# Patient Record
Sex: Female | Born: 2000 | Hispanic: Refuse to answer | Marital: Single | Smoking: Never smoker
Health system: Southern US, Community
[De-identification: ages and names within clinical notes are randomized; demographics above are authoritative.]

---
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
## 2018-10-16 ENCOUNTER — Ambulatory Visit (INDEPENDENT_AMBULATORY_CARE_PROVIDER_SITE_OTHER): Payer: BLUE CROSS/BLUE SHIELD | Admitting: Family Medicine

## 2018-10-16 VITALS — BP 139/85 | HR 92 | Temp 98.7°F | Resp 14

## 2018-10-16 DIAGNOSIS — R509 Fever, unspecified: Secondary | ICD-10-CM

## 2018-10-16 DIAGNOSIS — J069 Acute upper respiratory infection, unspecified: Secondary | ICD-10-CM

## 2018-10-16 LAB — POCT INFLUENZA A/B
Influenza A, POC: NEGATIVE
Influenza B, POC: NEGATIVE

## 2018-10-16 LAB — POCT RAPID STREP A (OFFICE): RAPID STREP A SCREEN: NEGATIVE

## 2018-10-16 MED ORDER — OSELTAMIVIR PHOSPHATE 75 MG PO CAPS: 75.0000 mg | ORAL_CAPSULE | Freq: Two times a day (BID) | ORAL | 0 refills | Status: DC

## 2018-10-16 NOTE — Progress Notes (Signed)
Patient presents today with symptoms of subjective fever, headache, sore throat, nasal congestion, cough, body aches, night sweats.  Patient states that her symptoms started yesterday.  Patient has had infectious mono in the past.  She denies any chest pain, shortness of breath, nausea, vomiting, diarrhea, abdominal pain, rash.  She has taken NyQuil for her symptoms.  She denies any sick contacts.  She denies any history of asthma or smoking.  ROS: Negative except mentioned above. Vitals as per Epic. GENERAL: NAD HEENT: mild pharyngeal erythema, no exudate, no erythema of TMs, shotty cervical LAD RESP: CTA B CARD: RRR NEURO: CN II-XII grossly intact   A/P: Viral Illness - suggestive of influenza, rapid strep test and flu screen in the office were negative, discussed OTC meds for symptom relief, will prescribe Tamiflu and patient will decide if she wants to take it, rest, hydration, no class or athletic activity today, no athletic activity or class until afebrile for 24 hours without taking fever lowering medication, seek medical attention if symptoms persist or worsen as discussed.

## 2018-10-17 ENCOUNTER — Other Ambulatory Visit: Payer: Self-pay | Admitting: Family Medicine

## 2018-10-17 MED ORDER — OSELTAMIVIR PHOSPHATE 75 MG PO CAPS: 75.0000 mg | ORAL_CAPSULE | Freq: Two times a day (BID) | ORAL | 0 refills | Status: DC

## 2018-10-19 ENCOUNTER — Other Ambulatory Visit: Payer: Self-pay | Admitting: Family Medicine

## 2018-10-19 ENCOUNTER — Encounter: Payer: Self-pay | Admitting: Family Medicine

## 2018-10-19 ENCOUNTER — Ambulatory Visit (INDEPENDENT_AMBULATORY_CARE_PROVIDER_SITE_OTHER): Payer: BLUE CROSS/BLUE SHIELD | Admitting: Family Medicine

## 2018-10-19 VITALS — BP 111/61 | HR 77 | Temp 98.2°F | Resp 14

## 2018-10-19 DIAGNOSIS — R05 Cough: Secondary | ICD-10-CM

## 2018-10-19 DIAGNOSIS — R059 Cough, unspecified: Secondary | ICD-10-CM

## 2018-10-19 LAB — CBC WITH DIFFERENTIAL/PLATELET
BASOS: 0 %
Basophils Absolute: 0 10*3/uL (ref 0.0–0.2)
EOS (ABSOLUTE): 0.3 10*3/uL (ref 0.0–0.4)
EOS: 3 %
Hematocrit: 40.3 % (ref 34.0–46.6)
Hemoglobin: 13.3 g/dL (ref 11.1–15.9)
Immature Grans (Abs): 0 10*3/uL (ref 0.0–0.1)
Immature Granulocytes: 0 %
Lymphocytes Absolute: 2.1 10*3/uL (ref 0.7–3.1)
Lymphs: 21 %
MCH: 30.2 pg (ref 26.6–33.0)
MCHC: 33 g/dL (ref 31.5–35.7)
MCV: 92 fL (ref 79–97)
MONOS ABS: 1 10*3/uL — AB (ref 0.1–0.9)
Monocytes: 10 %
Neutrophils Absolute: 6.6 10*3/uL (ref 1.4–7.0)
Neutrophils: 66 %
Platelets: 269 10*3/uL (ref 150–450)
RBC: 4.4 x10E6/uL (ref 3.77–5.28)
RDW: 12.4 % (ref 11.7–15.4)
WBC: 10.1 10*3/uL (ref 3.4–10.8)

## 2018-10-19 MED ORDER — AZITHROMYCIN 250 MG PO TABS: ORAL_TABLET | ORAL | 0 refills | Status: DC

## 2018-10-19 MED ORDER — BENZONATATE 100 MG PO CAPS: 100.0000 mg | ORAL_CAPSULE | Freq: Two times a day (BID) | ORAL | 0 refills | Status: DC | PRN

## 2018-10-19 NOTE — Progress Notes (Signed)
Patient presents today with symptoms of cough.  Patient was seen a few days ago and diagnosed with influenza despite a negative flu test.  She has started the Tamiflu.  She states that her myalgias and night sweats have stopped.  She however has a productive cough and some chest tightness when she coughs.  She denies any wheezing or shortness of breath.  She does not have a history of asthma or does not smoke.  She has been trying Delsym for her cough.  ROS: Negative except mentioned above. Vitals as per Epic. GENERAL: NAD HEENT: no pharyngeal erythema, no exudate, no erythema of TMs, no cervical LAD RESP: CTA B CARD: RRR NEURO: CN II-XII grossly intact   A/P: URI -will do CBC and chest x-ray, patient is to continue Tamiflu for now, will discuss adding antibiotic if needed once results have been reviewed, would also consider possibly adding albuterol inhaler and also prescription cough medicine if needed.  No athletic activity for now.  Can go to class if afebrile for over 24 hours without taking fever lowering medication.  Seek medical attention if symptoms persist or worsen as discussed.

## 2018-11-28 HISTORY — PX: APPENDECTOMY: SHX54

## 2019-04-17 ENCOUNTER — Other Ambulatory Visit: Payer: Self-pay | Admitting: Family Medicine

## 2019-04-17 DIAGNOSIS — Z8619 Personal history of other infectious and parasitic diseases: Secondary | ICD-10-CM

## 2019-04-17 DIAGNOSIS — Z8616 Personal history of COVID-19: Secondary | ICD-10-CM

## 2019-05-02 ENCOUNTER — Other Ambulatory Visit: Payer: Self-pay

## 2019-05-02 ENCOUNTER — Encounter

## 2019-05-02 ENCOUNTER — Encounter: Payer: Self-pay | Admitting: Cardiovascular Disease

## 2019-05-02 ENCOUNTER — Ambulatory Visit (INDEPENDENT_AMBULATORY_CARE_PROVIDER_SITE_OTHER): Payer: BC Managed Care – PPO | Admitting: Cardiovascular Disease

## 2019-05-02 VITALS — BP 138/70 | HR 92 | Ht 68.0 in | Wt 158.8 lb

## 2019-05-02 DIAGNOSIS — R079 Chest pain, unspecified: Secondary | ICD-10-CM

## 2019-05-02 NOTE — Patient Instructions (Signed)
Medication Instructions:  Your physician recommends that you continue on your current medications as directed. Please refer to the Current Medication list given to you today.  If you need a refill on your cardiac medications before your next appointment, please call your pharmacy.   Lab work: None ordered If you have labs (blood work) drawn today and your tests are completely normal, you will receive your results only by: Marland Kitchen MyChart Message (if you have MyChart) OR . A paper copy in the mail If you have any lab test that is abnormal or we need to change your treatment, we will call you to review the results.  Testing/Procedures: Your physician has requested that you have an echocardiogram. Echocardiography is a painless test that uses sound waves to create images of your heart. It provides your doctor with information about the size and shape of your heart and how well your heart's chambers and valves are working. This procedure takes approximately one hour. There are no restrictions for this procedure.    Follow-Up: At [facility], you and your health needs are our priority.  As part of our continuing mission to provide you with exceptional heart care, we have created designated Provider Care Teams.  These Care Teams include your primary Cardiologist (physician) and Advanced Practice Providers (APPs -  Physician Assistants and Nurse Practitioners) who all work together to provide you with the care you need, when you need it. You will need a follow up appointment as neeed  You may see  Dr. Fletcher Anon or one of the following Advanced Practice Providers on your designated Care Team:   Murray Hodgkins, NP Christell Faith, PA-C . Marrianne Mood, PA-C  Any Other Special Instructions Will Be Listed Below (If Applicable). N/A

## 2019-05-02 NOTE — Progress Notes (Signed)
Cardiology Office Note   Date:  05/02/2019   ID:  Marie Hunter, DOB 2000/10/21, MRN [id number]  PCP:  Paulina Fusi, MD  Cardiologist:   Kathlyn Sacramento, MD   Chief Complaint  Patient presents with  . New Patient (Initial Visit)    Patient c/o chest tightness. Meds reviewed verbally with patient.       History of Present Illness: Marie Hunter is a 18 y.o. female who was referred by Dr. Posey Pronto for evaluation of chest pain.  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  She had COVID-19 in March and was sick for about 2 weeks.  Her symptoms included loss of taste and smell as well as chest pain and shortness of breath.  The chest pain was described as sharp discomfort which was worse with taking a deep breath or lying on her back.  She felt well after that but then developed appendicitis in April that required emergency appendectomy.  Since then, she had decreased exercise tolerance and had episodes of chest tightness with exertion.  There is also some discomfort in the left side of the neck.  She has not been able to return to her previous level of functioning. She is a lifelong non-smoker and does not drink alcohol.  There is no family history of coronary artery disease, arrhythmia or sudden death.    History reviewed. No pertinent past medical history.  Past Surgical History:  Procedure Laterality Date  . APPENDECTOMY  11/2018     No current outpatient medications on file.   No current facility-administered medications for this visit.     Allergies:   Patient has no known allergies.    Social History:  The patient  reports that she has never smoked. She has never used smokeless tobacco. She reports that she does not drink alcohol or use drugs.   Family History:  The patient's family history includes Diabetes in her paternal grandmother.    ROS:  Please see the history of present illness.   Otherwise, review of systems are positive for none.   All other systems are  reviewed and negative.    PHYSICAL EXAM: VS:  BP 138/70 (BP Location: Left Arm, Patient Position: Sitting, Cuff Size: Normal)   Pulse 92   Ht 5\' 8"  (1.727 m)   Wt 158 lb 12 oz (72 kg)   BMI 24.14 kg/m  , BMI Body mass index is 24.14 kg/m. GEN: Well nourished, well developed, in no acute distress  HEENT: normal  Neck: no JVD, carotid bruits, or masses Cardiac: RRR; no murmurs, rubs, or gallops,no edema  Respiratory:  clear to auscultation bilaterally, normal work of breathing GI: soft, nontender, nondistended, + BS MS: no deformity or atrophy  Skin: warm and dry, no rash Neuro:  Strength and sensation are intact Psych: euthymic mood, full affect   EKG:  EKG is ordered today. The ekg ordered today demonstrates normal sinus rhythm with RSR prime pattern which is a normal variant especially in young people.   Recent Labs: 10/19/2018: Hemoglobin 13.3; Platelets 269    Lipid Panel No results found for: CHOL, TRIG, HDL, CHOLHDL, VLDL, LDLCALC, LDLDIRECT    Wt Readings from Last 3 Encounters:  05/02/19 158 lb 12 oz (72 kg)       No flowsheet data found.    ASSESSMENT AND PLAN:  1.  Chest pain: The patient had COVID-19 in March.  She had pleuritic chest pain at that time and it is possible that there  was some myocardial involvement such as pericarditis or myocarditis.  Due to that, I recommend evaluation with an echocardiogram.  If echocardiogram comes back unremarkable, no further cardiac testing is needed.  Her current EKG does not show any significant abnormalities. Her blood pressure is borderline elevated today but I suggest monitoring this for now given that her blood pressure usually does not run high.  If her blood pressures start running above 130 systolic on a regular basis, she might require work-up for secondary hypertension but I do not think that is needed at the present time.    Disposition:   FU with me as needed  Signed,  Lorine BearsMuhammad Kensy Blizard, MD  05/02/2019  1:52 PM    [facility]

## 2019-05-14 ENCOUNTER — Ambulatory Visit: Payer: BLUE CROSS/BLUE SHIELD | Admitting: Cardiology

## 2019-05-15 ENCOUNTER — Other Ambulatory Visit: Payer: Self-pay

## 2019-05-15 ENCOUNTER — Other Ambulatory Visit: Payer: BC Managed Care – PPO

## 2019-05-15 DIAGNOSIS — Z20822 Contact with and (suspected) exposure to covid-19: Secondary | ICD-10-CM

## 2019-05-16 LAB — NOVEL CORONAVIRUS, NAA: SARS-CoV-2, NAA: NOT DETECTED

## 2019-05-20 ENCOUNTER — Other Ambulatory Visit: Payer: BC Managed Care – PPO

## 2019-05-20 ENCOUNTER — Other Ambulatory Visit: Payer: Self-pay

## 2019-05-20 DIAGNOSIS — Z20822 Contact with and (suspected) exposure to covid-19: Secondary | ICD-10-CM

## 2019-05-22 LAB — NOVEL CORONAVIRUS, NAA: SARS-CoV-2, NAA: NOT DETECTED

## 2019-06-10 ENCOUNTER — Ambulatory Visit (INDEPENDENT_AMBULATORY_CARE_PROVIDER_SITE_OTHER): Payer: BC Managed Care – PPO

## 2019-06-10 ENCOUNTER — Other Ambulatory Visit: Payer: Self-pay

## 2019-06-10 DIAGNOSIS — R079 Chest pain, unspecified: Secondary | ICD-10-CM

## 2019-06-12 ENCOUNTER — Telehealth: Payer: Self-pay | Admitting: Cardiovascular Disease

## 2019-06-12 NOTE — Telephone Encounter (Signed)
Patient calling to check on status of ECHO results °Please call when available  °

## 2019-06-13 ENCOUNTER — Other Ambulatory Visit: Payer: BC Managed Care – PPO

## 2019-06-13 ENCOUNTER — Other Ambulatory Visit: Payer: BC Managed Care – PPO | Admitting: Family Medicine

## 2019-06-13 DIAGNOSIS — Z8619 Personal history of other infectious and parasitic diseases: Secondary | ICD-10-CM

## 2019-06-13 DIAGNOSIS — Z8616 Personal history of COVID-19: Secondary | ICD-10-CM

## 2019-06-13 NOTE — Telephone Encounter (Signed)
-----   Message from Wellington Hampshire, MD sent at 06/13/2019 10:39 AM EDT ----- Inform patient that echo was normal.

## 2019-06-13 NOTE — Telephone Encounter (Signed)
See result note.  

## 2019-06-13 NOTE — Telephone Encounter (Signed)
Called to give the patient echo results. Lmtcb. Results also released to mychart.

## 2019-06-13 NOTE — Telephone Encounter (Signed)
Results reviewed by the patient in mychart. ?

## 2019-06-14 LAB — TROPONIN I: Troponin I: <0.01 ng/mL (ref 0.00–0.04)

## 2019-06-28 ENCOUNTER — Other Ambulatory Visit: Payer: Self-pay | Admitting: *Deleted

## 2019-06-28 DIAGNOSIS — Z20822 Contact with and (suspected) exposure to covid-19: Secondary | ICD-10-CM

## 2019-06-29 LAB — NOVEL CORONAVIRUS, NAA: SARS-CoV-2, NAA: NOT DETECTED

## 2019-06-30 IMAGING — CR DG CHEST 2V
1 series · 2 of 2 positions shown · non-contrast
Comparison: None

CLINICAL DATA: Cough for 3 months, mold exposure in dorm

EXAM:
CHEST  2 VIEW

[Series 1: dg chest 2 view · 0.14mm/px · 2 of 2 slices shown]
[im 1/2]
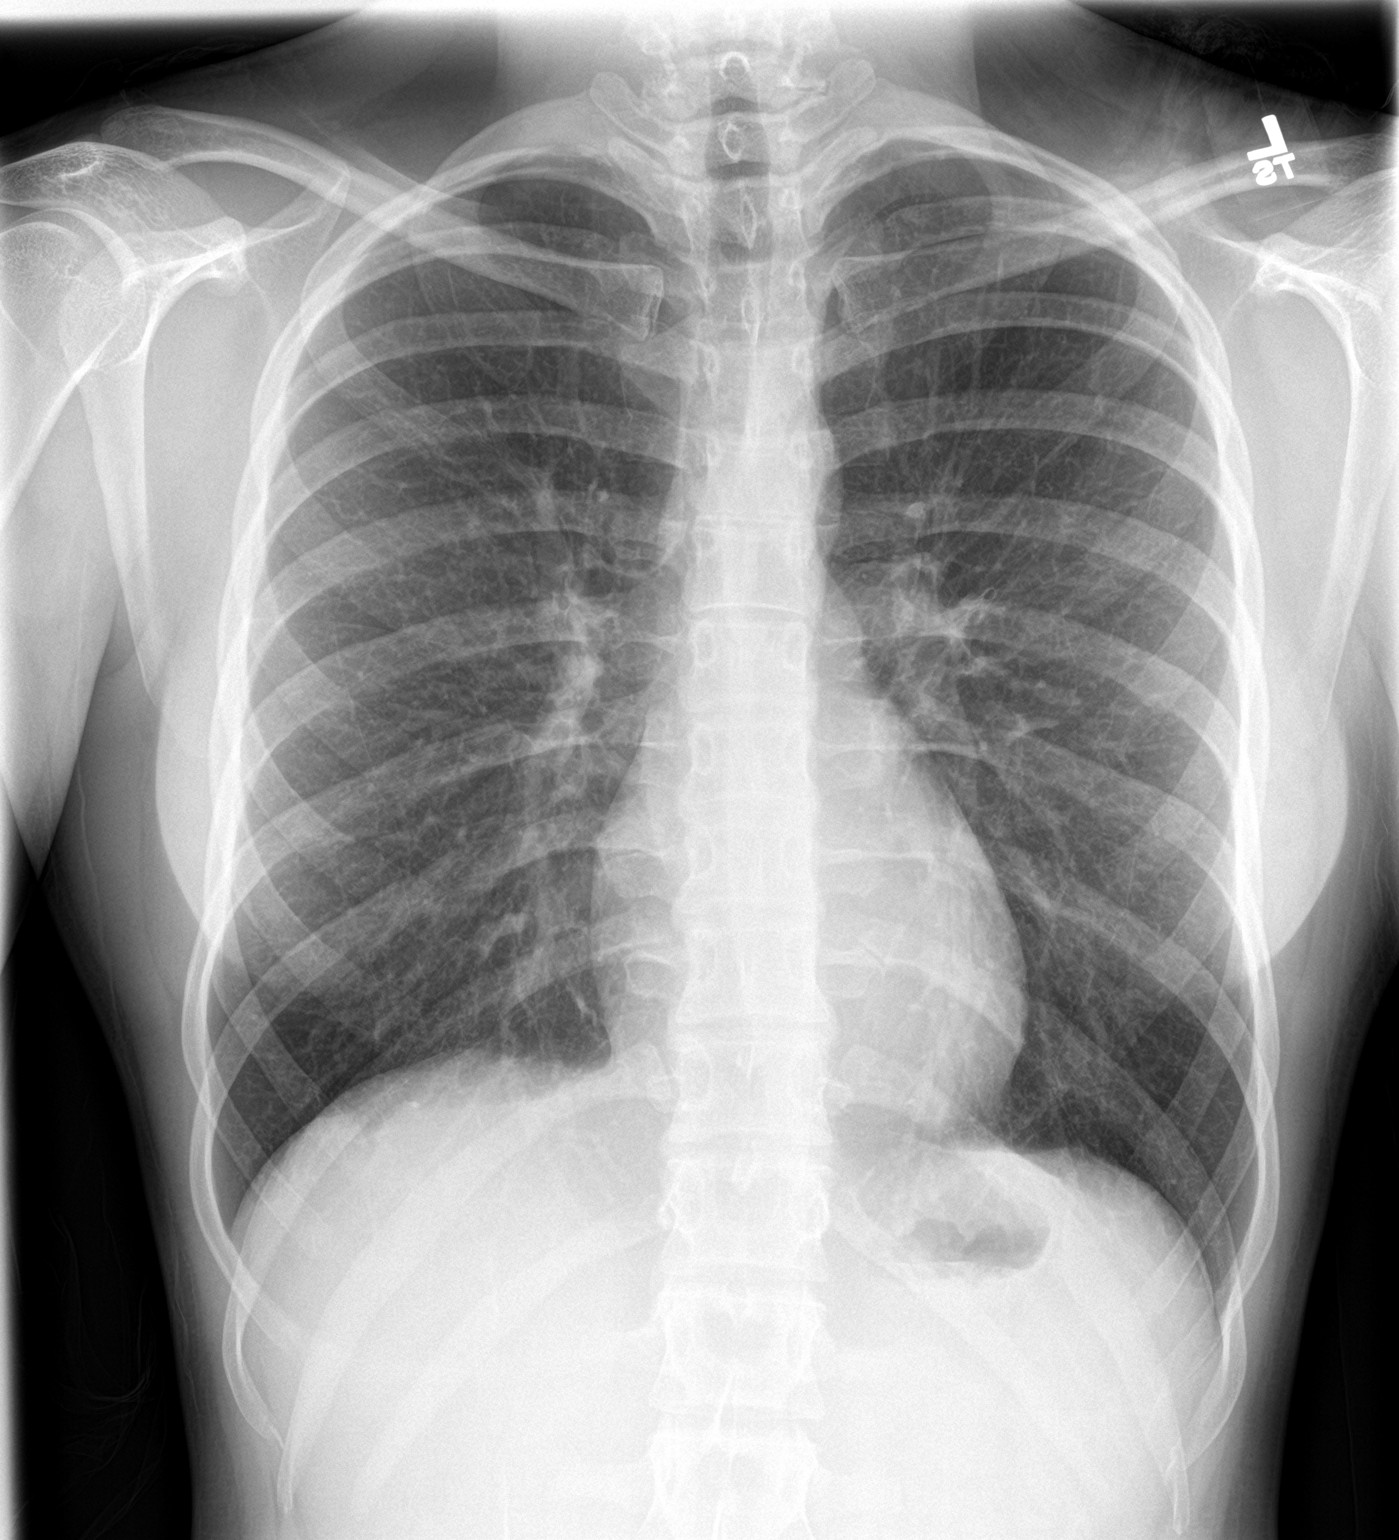
[im 2/2]
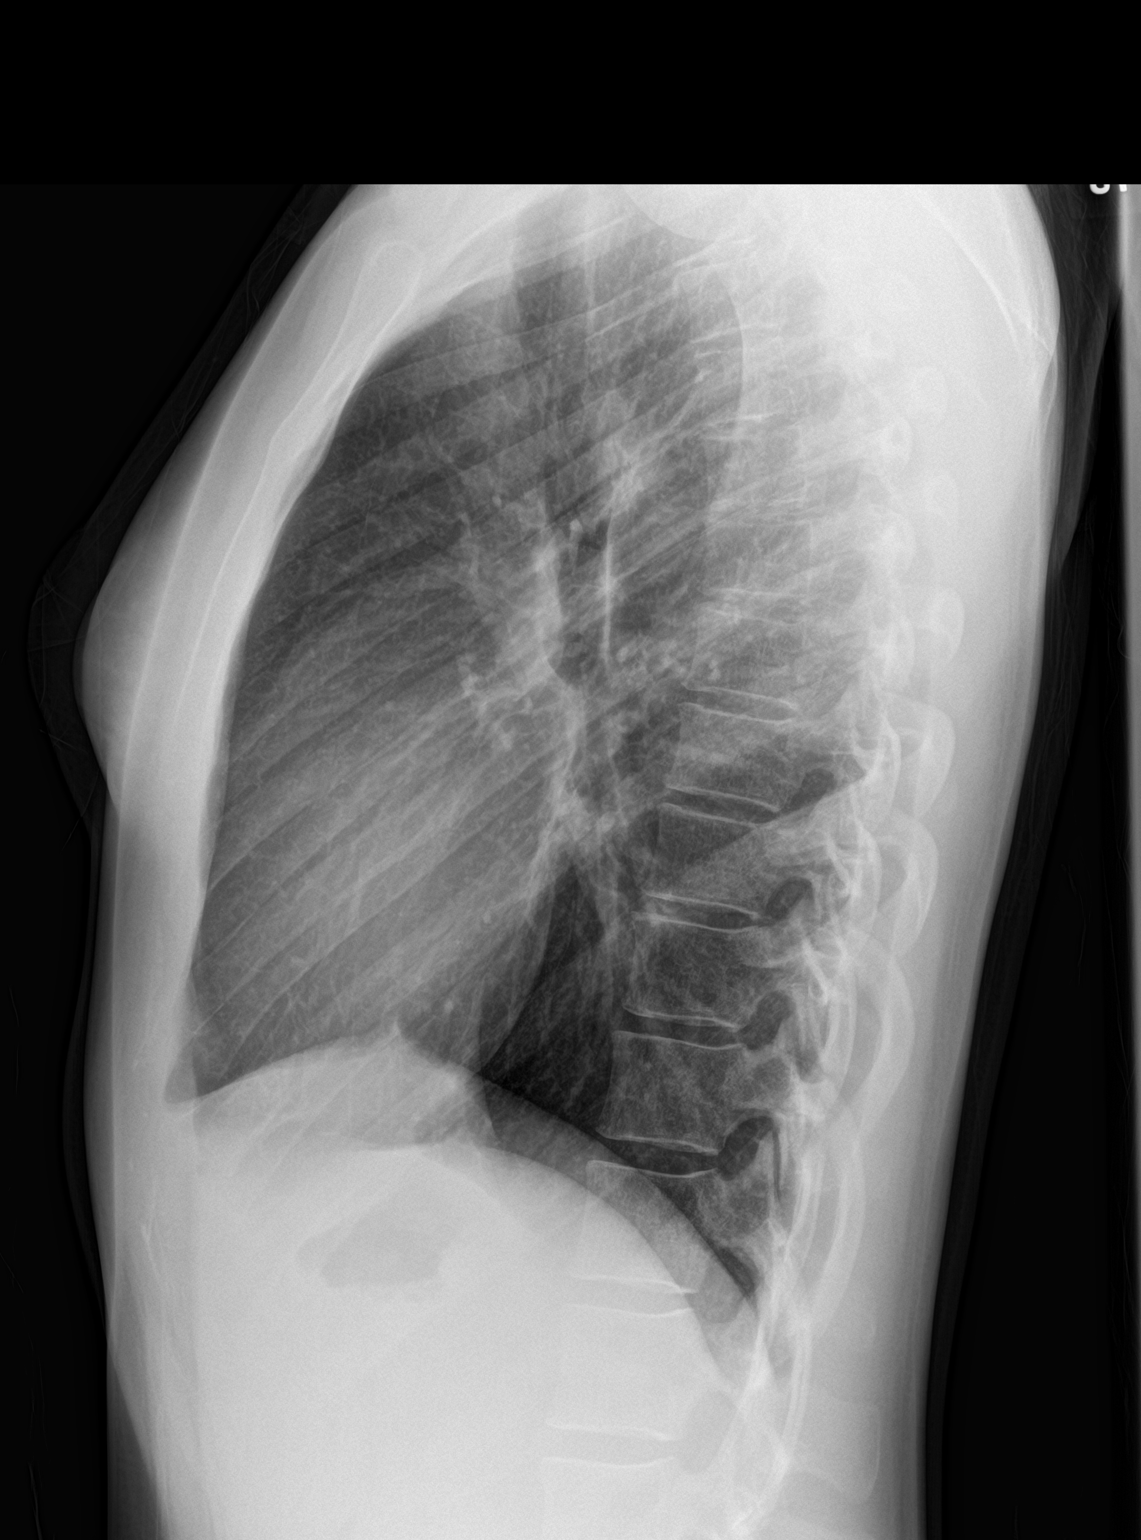

[2 of 2 positions shown; findings below may reference images not displayed]

FINDINGS: Normal heart size, mediastinal contours, and pulmonary vascularity.

Mild peribronchial thickening.

No pulmonary infiltrate, pleural effusion, or pneumothorax.

Bones unremarkable.
IMPRESSION: Mild bronchitic changes without infiltrate.

## 2020-04-24 ENCOUNTER — Ambulatory Visit
Admission: RE | Admit: 2020-04-24 | Discharge: 2020-04-24 | Disposition: A | Discharge status: Home / Self Care | Payer: BC Managed Care – PPO | Source: Ambulatory Visit | Attending: Emergency Medicine | Admitting: Emergency Medicine

## 2020-04-24 ENCOUNTER — Other Ambulatory Visit: Payer: Self-pay

## 2020-04-24 VITALS — BP 124/80 | HR 108 | Temp 98.0°F | Resp 17

## 2020-04-24 DIAGNOSIS — R197 Diarrhea, unspecified: Secondary | ICD-10-CM | POA: Diagnosis not present

## 2020-04-24 DIAGNOSIS — R110 Nausea: Secondary | ICD-10-CM

## 2020-04-24 DIAGNOSIS — B349 Viral infection, unspecified: Secondary | ICD-10-CM | POA: Diagnosis not present

## 2020-04-24 MED ORDER — ONDANSETRON HCL 4 MG PO TABS: 4.0000 mg | ORAL_TABLET | Freq: Four times a day (QID) | ORAL | 0 refills | Status: AC | PRN

## 2020-04-24 NOTE — Discharge Instructions (Addendum)
Take the antinausea medication as directed.    Keep yourself hydrated with clear liquids, such as water, Gatorade, Pedialyte, Sprite, or ginger ale.  Follow the attached diarrhea diet.    Your COVID test is pending.  You should self quarantine until the test result is back.    Take Tylenol as needed for fever or discomfort.  Rest and keep yourself hydrated.    Go to the emergency department if you develop acute worsening symptoms.

## 2020-04-24 NOTE — ED Triage Notes (Signed)
Patient complains of headache, fever, body aches and diarrhea x3 days. Denies sore throat. Endorses cough but reports she tried to avoid coughing because her body is so sore. Patient is fully vaccinated but is willing to get COVID testing.

## 2020-04-24 NOTE — ED Provider Notes (Signed)
Renaldo Fiddler    CSN: [id number] Arrival date & time: 04/24/20  1437      History   Chief Complaint Chief Complaint  Patient presents with  . Fever  . Generalized Body Aches  . Diarrhea  . Headache    HPI Marie Hunter is a 19 y.o. female.   Patient presents with 3-day history of fever, chills, headache, body aches, fatigue, nausea, diarrhea.  T-max 101.  She denies rash, sore throat, cough, shortness of breath, abdominal pain, vomiting, or other symptoms.  She recently flew here from [place].  The history is provided by the patient.    History reviewed. No pertinent past medical history.  There are no problems to display for this patient.   Past Surgical History:  Procedure Laterality Date  . APPENDECTOMY  11/2018    OB History   No obstetric history on file.      Home Medications    Prior to Admission medications   Medication Sig Start Date End Date Taking? Authorizing Provider  ondansetron (ZOFRAN) 4 MG tablet Take 1 tablet (4 mg total) by mouth every 6 (six) hours as needed for nausea or vomiting. 04/24/20   Mickie Bail, NP    Family History Family History  Problem Relation Age of Onset  . Diabetes Paternal Grandmother     Social History Social History   Tobacco Use  . Smoking status: Never Smoker  . Smokeless tobacco: Never Used  Substance Use Topics  . Alcohol use: Never  . Drug use: Never     Allergies   Patient has no known allergies.   Review of Systems Review of Systems  Constitutional: Positive for chills, fatigue and fever.  HENT: Negative for ear pain and sore throat.   Eyes: Negative for pain and visual disturbance.  Respiratory: Negative for cough and shortness of breath.   Cardiovascular: Negative for chest pain and palpitations.  Gastrointestinal: Positive for diarrhea. Negative for abdominal pain and vomiting.  Genitourinary: Negative for dysuria and hematuria.  Musculoskeletal: Negative for arthralgias  and back pain.  Skin: Negative for color change and rash.  Neurological: Positive for headaches. Negative for dizziness, seizures, syncope, weakness and numbness.  All other systems reviewed and are negative.    Physical Exam Triage Vital Signs ED Triage Vitals  Enc Vitals Group     BP      Pulse      Resp      Temp      Temp src      SpO2      Weight      Height      Head Circumference      Peak Flow      Pain Score      Pain Loc      Pain Edu?      Excl. in GC?    No data found.  Updated Vital Signs BP 124/80   Pulse (!) 108   Temp 98 F (36.7 C)   Resp 17   LMP 04/03/2020 (Within Days)   SpO2 97%   Visual Acuity Right Eye Distance:   Left Eye Distance:   Bilateral Distance:    Right Eye Near:   Left Eye Near:    Bilateral Near:     Physical Exam Vitals and nursing note reviewed.  Constitutional:      General: She is not in acute distress.    Appearance: She is well-developed. She is not ill-appearing.  HENT:  Head: Normocephalic and atraumatic.     Right Ear: Tympanic membrane normal.     Left Ear: Tympanic membrane normal.     Nose: Nose normal.     Mouth/Throat:     Mouth: Mucous membranes are moist.     Pharynx: Oropharynx is clear.  Eyes:     Conjunctiva/sclera: Conjunctivae normal.  Cardiovascular:     Rate and Rhythm: Normal rate and regular rhythm.     Heart sounds: No murmur heard.   Pulmonary:     Effort: Pulmonary effort is normal. No respiratory distress.     Breath sounds: Normal breath sounds. No wheezing or rhonchi.  Abdominal:     General: Bowel sounds are normal.     Palpations: Abdomen is soft.     Tenderness: There is no abdominal tenderness. There is no guarding or rebound.  Musculoskeletal:     Cervical back: Neck supple.  Skin:    General: Skin is warm and dry.     Findings: No rash.  Neurological:     General: No focal deficit present.     Mental Status: She is alert and oriented to person, place, and time.      Gait: Gait normal.  Psychiatric:        Mood and Affect: Mood normal.        Behavior: Behavior normal.      UC Treatments / Results  Labs (all labs ordered are listed, but only abnormal results are displayed) Labs Reviewed  NOVEL CORONAVIRUS, NAA    EKG   Radiology No results found.  Procedures Procedures (including critical care time)  Medications Ordered in UC Medications - No data to display  Initial Impression / Assessment and Plan / UC Course  I have reviewed the triage vital signs and the nursing notes.  Pertinent labs & imaging results that were available during my care of the patient were reviewed by me and considered in my medical decision making (see chart for details).   Viral illness without vomiting, diarrhea.  PCR COVID pending.  Patient instructed to self quarantine until the test result is back.  Zofran as needed for nausea.  Rest, hydration with clear liquids, advance to diarrhea diet as tolerated.  Instructed patient to go to the ED if she has acute worsening symptoms.  Patient agrees to plan of care.   Final Clinical Impressions(s) / UC Diagnoses   Final diagnoses:  Viral illness  Nausea without vomiting  Diarrhea, unspecified type     Discharge Instructions     Take the antinausea medication as directed.    Keep yourself hydrated with clear liquids, such as water, Gatorade, Pedialyte, Sprite, or ginger ale.  Follow the attached diarrhea diet.    Your COVID test is pending.  You should self quarantine until the test result is back.    Take Tylenol as needed for fever or discomfort.  Rest and keep yourself hydrated.    Go to the emergency department if you develop acute worsening symptoms.           ED Prescriptions    Medication Sig Dispense Auth. Provider   ondansetron (ZOFRAN) 4 MG tablet Take 1 tablet (4 mg total) by mouth every 6 (six) hours as needed for nausea or vomiting. 12 tablet Mickie Bail, NP     PDMP not reviewed  this encounter.   Mickie Bail, NP 04/24/20 1517

## 2020-04-26 LAB — NOVEL CORONAVIRUS, NAA: SARS-CoV-2, NAA: NOT DETECTED

## 2020-04-26 LAB — SARS-COV-2, NAA 2 DAY TAT

## 2020-06-10 ENCOUNTER — Other Ambulatory Visit: Payer: Self-pay

## 2020-06-10 ENCOUNTER — Ambulatory Visit
Admission: EM | Admit: 2020-06-10 | Discharge: 2020-06-10 | Disposition: A | Discharge status: Home / Self Care | Payer: BC Managed Care – PPO | Attending: Emergency Medicine | Admitting: Emergency Medicine

## 2020-06-10 DIAGNOSIS — J020 Streptococcal pharyngitis: Secondary | ICD-10-CM

## 2020-06-10 LAB — POCT RAPID STREP A (OFFICE): Rapid Strep A Screen: POSITIVE — AB

## 2020-06-10 MED ORDER — PENICILLIN G BENZATHINE 1200000 UNIT/2ML IM SUSP
1.2000 10*6.[IU] | Freq: Once | INTRAMUSCULAR | Status: AC
  Administered 2020-06-10: 1.2 10*6.[IU] via INTRAMUSCULAR

## 2020-06-10 NOTE — ED Triage Notes (Signed)
Pt presents with cough x 1-2 weeks and sore throat x 3 days.  Roommates also sick and on z pak.  Pt does not want COVID test.  Had mono two years ago.

## 2020-06-10 NOTE — ED Provider Notes (Signed)
Marie Hunter    CSN: [id number] Arrival date & time: 06/10/20  8250      History   Chief Complaint Chief Complaint  Patient presents with  . Sore Throat    HPI Marie Hunter is a 19 y.o. female.   Patient presents with 2-week history of cough productive of green sputum, nasal congestion, postnasal drip.  She also reports a sore throat x3 days.  Her roommates have had similar symptoms.  She denies fever, chills, rash, shortness of breath, vomiting, diarrhea, or other symptoms.  OTC treatments attempted.  She declines COVID test today.  The history is provided by the patient.    History reviewed. No pertinent past medical history.  There are no problems to display for this patient.   Past Surgical History:  Procedure Laterality Date  . APPENDECTOMY  11/2018    OB History   No obstetric history on file.      Home Medications    Prior to Admission medications   Medication Sig Start Date End Date Taking? Authorizing Provider  ondansetron (ZOFRAN) 4 MG tablet Take 1 tablet (4 mg total) by mouth every 6 (six) hours as needed for nausea or vomiting. 04/24/20   Mickie Bail, NP    Family History Family History  Problem Relation Age of Onset  . Diabetes Paternal Grandmother     Social History Social History   Tobacco Use  . Smoking status: Never Smoker  . Smokeless tobacco: Never Used  Substance Use Topics  . Alcohol use: Never  . Drug use: Never     Allergies   Patient has no known allergies.   Review of Systems Review of Systems  Constitutional: Negative for chills and fever.  HENT: Positive for congestion, postnasal drip and sore throat. Negative for ear pain.   Eyes: Negative for pain and visual disturbance.  Respiratory: Positive for cough. Negative for shortness of breath.   Cardiovascular: Negative for chest pain and palpitations.  Gastrointestinal: Negative for abdominal pain, diarrhea and vomiting.  Genitourinary: Negative for  dysuria and hematuria.  Musculoskeletal: Negative for arthralgias and back pain.  Skin: Negative for color change and rash.  Neurological: Negative for seizures and syncope.  All other systems reviewed and are negative.    Physical Exam Triage Vital Signs ED Triage Vitals  Enc Vitals Group     BP      Pulse      Resp      Temp      Temp src      SpO2      Weight      Height      Head Circumference      Peak Flow      Pain Score      Pain Loc      Pain Edu?      Excl. in GC?    No data found.  Updated Vital Signs BP 124/78 (BP Location: Right Arm)   Pulse 82   Temp 99.1 F (37.3 C) (Oral)   Resp 16   SpO2 97%   Visual Acuity Right Eye Distance:   Left Eye Distance:   Bilateral Distance:    Right Eye Near:   Left Eye Near:    Bilateral Near:     Physical Exam Vitals and nursing note reviewed.  Constitutional:      General: She is not in acute distress.    Appearance: She is well-developed.  HENT:     Head: Normocephalic  and atraumatic.     Right Ear: Tympanic membrane normal.     Left Ear: Tympanic membrane normal.     Nose: Congestion present.     Mouth/Throat:     Mouth: Mucous membranes are moist.     Pharynx: Posterior oropharyngeal erythema present. No oropharyngeal exudate.     Tonsils: 1+ on the right. 1+ on the left.  Eyes:     Conjunctiva/sclera: Conjunctivae normal.  Cardiovascular:     Rate and Rhythm: Normal rate and regular rhythm.     Heart sounds: No murmur heard.   Pulmonary:     Effort: Pulmonary effort is normal. No respiratory distress.     Breath sounds: Normal breath sounds.  Abdominal:     Palpations: Abdomen is soft.     Tenderness: There is no abdominal tenderness. There is no guarding or rebound.  Musculoskeletal:     Cervical back: Neck supple.  Skin:    General: Skin is warm and dry.     Findings: No rash.  Neurological:     General: No focal deficit present.     Mental Status: She is alert and oriented to person,  place, and time.     Gait: Gait normal.  Psychiatric:        Mood and Affect: Mood normal.        Behavior: Behavior normal.      UC Treatments / Results  Labs (all labs ordered are listed, but only abnormal results are displayed) Labs Reviewed  POCT RAPID STREP A (OFFICE)    EKG   Radiology No results found.  Procedures Procedures (including critical care time)  Medications Ordered in UC Medications  penicillin g benzathine (BICILLIN LA) 1200000 UNIT/2ML injection 1.2 Million Units (has no administration in time range)    Initial Impression / Assessment and Plan / UC Course  I have reviewed the triage vital signs and the nursing notes.  Pertinent labs & imaging results that were available during my care of the patient were reviewed by me and considered in my medical decision making (see chart for details).   Strep throat.  Rapid strep positive.  Treated with Bicillin LA. Tylenol or ibuprofen as needed for fever or discomfort.  Patient declined COVID test today.  Instructed her to follow-up with her PCP if her symptoms are not improving.  Patient agrees to plan of care.   Final Clinical Impressions(s) / UC Diagnoses   Final diagnoses:  Streptococcal sore throat     Discharge Instructions     You have strep throat.  You were treated with a long-acting penicillin injection today.  No additional antibiotics are needed.  Take Tylenol or ibuprofen as needed for discomfort.    Follow up with your primary care provider if your symptoms are not improving.       ED Prescriptions    None     PDMP not reviewed this encounter.   Mickie Bail, NP 06/10/20 1027

## 2020-06-10 NOTE — Discharge Instructions (Addendum)
You have strep throat.  You were treated with a long-acting penicillin injection today.  No additional antibiotics are needed.  Take Tylenol or ibuprofen as needed for discomfort.    Follow up with your primary care provider if your symptoms are not improving.

## 2020-06-22 ENCOUNTER — Ambulatory Visit
Admit: 2020-06-22 | Discharge: 2020-06-22 | Disposition: A | Discharge status: Home / Self Care | Payer: BC Managed Care – PPO
